# Patient Record
Sex: Male | Born: 1990 | Hispanic: No | Marital: Single | Smoking: Light tobacco smoker
Health system: Southern US, Community
[De-identification: ages and names within clinical notes are randomized; demographics above are authoritative.]

## PROBLEM LIST (undated history)

## (undated) DIAGNOSIS — K4090 Unilateral inguinal hernia, without obstruction or gangrene, not specified as recurrent: Secondary | ICD-10-CM

## (undated) DIAGNOSIS — J45909 Unspecified asthma, uncomplicated: Secondary | ICD-10-CM

## (undated) DIAGNOSIS — Z9889 Other specified postprocedural states: Secondary | ICD-10-CM

## (undated) DIAGNOSIS — R112 Nausea with vomiting, unspecified: Secondary | ICD-10-CM

## (undated) DIAGNOSIS — M5126 Other intervertebral disc displacement, lumbar region: Secondary | ICD-10-CM

## (undated) DIAGNOSIS — G971 Other reaction to spinal and lumbar puncture: Secondary | ICD-10-CM

## (undated) DIAGNOSIS — Z889 Allergy status to unspecified drugs, medicaments and biological substances status: Secondary | ICD-10-CM

## (undated) HISTORY — PX: CIRCUMCISION: SUR203

---

## 2016-09-06 ENCOUNTER — Emergency Department (HOSPITAL_COMMUNITY)
Admission: EM | Admit: 2016-09-06 | Discharge: 2016-09-06 | Disposition: A | Discharge status: Home / Self Care | Payer: Self-pay | Attending: Emergency Medicine | Admitting: Emergency Medicine

## 2016-09-06 ENCOUNTER — Encounter (HOSPITAL_COMMUNITY): Payer: Self-pay

## 2016-09-06 DIAGNOSIS — Y939 Activity, unspecified: Secondary | ICD-10-CM | POA: Insufficient documentation

## 2016-09-06 DIAGNOSIS — Y929 Unspecified place or not applicable: Secondary | ICD-10-CM | POA: Insufficient documentation

## 2016-09-06 DIAGNOSIS — T2112XA Burn of first degree of abdominal wall, initial encounter: Secondary | ICD-10-CM | POA: Insufficient documentation

## 2016-09-06 DIAGNOSIS — Z23 Encounter for immunization: Secondary | ICD-10-CM | POA: Insufficient documentation

## 2016-09-06 DIAGNOSIS — X19XXXA Contact with other heat and hot substances, initial encounter: Secondary | ICD-10-CM | POA: Insufficient documentation

## 2016-09-06 DIAGNOSIS — T24232A Burn of second degree of left lower leg, initial encounter: Secondary | ICD-10-CM

## 2016-09-06 DIAGNOSIS — Y990 Civilian activity done for income or pay: Secondary | ICD-10-CM | POA: Insufficient documentation

## 2016-09-06 DIAGNOSIS — T24231A Burn of second degree of right lower leg, initial encounter: Secondary | ICD-10-CM | POA: Insufficient documentation

## 2016-09-06 MED ORDER — BACITRACIN ZINC 500 UNIT/GM EX OINT
1.0000 "application " | TOPICAL_OINTMENT | Freq: Two times a day (BID) | CUTANEOUS | Status: DC
  Administered 2016-09-06: 1 via TOPICAL
  Filled 2016-09-06: qty 0.9

## 2016-09-06 MED ORDER — TETANUS-DIPHTH-ACELL PERTUSSIS 5-2.5-18.5 LF-MCG/0.5 IM SUSP
0.5000 mL | Freq: Once | INTRAMUSCULAR | Status: AC
  Administered 2016-09-06: 0.5 mL via INTRAMUSCULAR
  Filled 2016-09-06: qty 0.5

## 2016-09-06 MED ORDER — BACITRACIN ZINC 500 UNIT/GM EX OINT: 1.0000 "application " | TOPICAL_OINTMENT | Freq: Two times a day (BID) | CUTANEOUS | 0 refills | Status: DC
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
## 2016-09-06 NOTE — ED Notes (Signed)
Pt agreed to change into gown.   Pt still refuses to be hooked up to pulse ox and bp cuff.
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
## 2018-04-10 ENCOUNTER — Ambulatory Visit: Payer: Self-pay | Admitting: General Surgery

## 2018-04-10 NOTE — H&P (Signed)
  History of Present Illness (Estephania Licciardi MD; 04/10/2018 11:13 AM) The patient is a 26 year old male who presents with an inguinal hernia. Chief Complaint: Right abdominal pain  Patient is a 26-year-old male, otherwise healthy, who comes in with 2 months history of right inguinal pain. He states that the pain occurred after lifting [employer]. He states these were approximately 100 pound bags. Patient states he tenses repetitively in his line of Work. Patient states that he has been on light duty recently secondary to the increased pain. He does state that he has noticed a bulge in the right inguinal area when he does do some increased lifting. He states that he's had no signs or symptoms of incarceration or strangulation.  The patient had no previous abdominal surgery.    Allergies (Tanisha A. Brown, RMA; 04/10/2018 10:50 AM) No Known Drug Allergies [04/10/2018]: Allergies Reconciled   Medication History (Tanisha A. Brown, RMA; 04/10/2018 10:50 AM) No Current Medications Medications Reconciled    Review of Systems (Lailany Enoch, MD; 04/10/2018 11:13 AM) All other systems negative  Vitals (Tanisha A. Brown RMA; 04/10/2018 10:50 AM) 04/10/2018 10:49 AM Weight: 268.6 lb Height: 71in Body Surface Area: 2.39 m Body Mass Index: 37.46 kg/m  Temp.: 98.6F  Pulse: 80 (Regular)  BP: 116/64 (Sitting, Left Arm, Standard)       Physical Exam (Adea Geisel MD; 04/10/2018 11:13 AM) The physical exam findings are as follows: Note:Constitutional: No acute distress, conversant, appears stated age  Eyes: Anicteric sclerae, moist conjunctiva, no lid lag  Neck: No thyromegaly, trachea midline, no cervical lymphadenopathy  Lungs: Clear to auscultation biilaterally, normal respiratory effot  Cardiovascular: regular rate & rhythm, no murmurs, no peripheal edema, pedal pulses 2+  GI: Soft, no masses or hepatosplenomegaly, non-tender to palpation  MSK:  Normal gait, no clubbing cyanosis, edema  Skin: No rashes, palpation reveals normal skin turgor  Psychiatric: Appropriate judgment and insight, oriented to person, place, and time  Abdomen Inspection Hernias - Inguinal hernia - Right - Reducible.    Assessment & Plan (Nazia Rhines MD; 04/10/2018 11:13 AM) RIGHT INGUINAL HERNIA (K40.90) Impression: 26-year-old male with a right inguinal hernia  1. The patient will like to proceed to the operating room for laparoscopic right inguinal hernia repair with mesh.  2. I discussed with the patient the signs and symptoms of incarceration and strangulation and the need to proceed to the ER should they occur.  3. I discussed with the patient the risks and benefits of the procedure to include but not limited to: Infection, bleeding, damage to surrounding structures, possible need for further surgery, possible nerve pain, and possible recurrence. The patient was understanding and wishes to proceed. 

## 2018-04-10 NOTE — H&P (View-Only) (Signed)
  History of Present Illness Axel Filler(Johnothan Bascomb MD; 04/10/2018 11:13 AM) The patient is a 27 year old male who presents with an inguinal hernia. Chief Complaint: Right abdominal pain  Patient is a 27 year old male, otherwise healthy, who comes in with 2 months history of right inguinal pain. He states that the pain occurred after lifting [employer]. He states these were approximately 100 pound bags. Patient states he tenses repetitively in his line of Work. Patient states that he has been on light duty recently secondary to the increased pain. He does state that he has noticed a bulge in the right inguinal area when he does do some increased lifting. He states that he's had no signs or symptoms of incarceration or strangulation.  The patient had no previous abdominal surgery.    Allergies (Tanisha A. Manson PasseyBrown, RMA; 04/10/2018 10:50 AM) No Known Drug Allergies [04/10/2018]: Allergies Reconciled   Medication History (Tanisha A. Manson PasseyBrown, RMA; 04/10/2018 10:50 AM) No Current Medications Medications Reconciled    Review of Systems Axel Filler(Laurel Harnden, MD; 04/10/2018 11:13 AM) All other systems negative  Vitals (Tanisha A. Brown RMA; 04/10/2018 10:50 AM) 04/10/2018 10:49 AM Weight: 268.6 lb Height: 71in Body Surface Area: 2.39 m Body Mass Index: 37.46 kg/m  Temp.: 98.42F  Pulse: 80 (Regular)  BP: 116/64 (Sitting, Left Arm, Standard)       Physical Exam Axel Filler(Halie Gass MD; 04/10/2018 11:13 AM) The physical exam findings are as follows: Note:Constitutional: No acute distress, conversant, appears stated age  Eyes: Anicteric sclerae, moist conjunctiva, no lid lag  Neck: No thyromegaly, trachea midline, no cervical lymphadenopathy  Lungs: Clear to auscultation biilaterally, normal respiratory effot  Cardiovascular: regular rate & rhythm, no murmurs, no peripheal edema, pedal pulses 2+  GI: Soft, no masses or hepatosplenomegaly, non-tender to palpation  MSK:  Normal gait, no clubbing cyanosis, edema  Skin: No rashes, palpation reveals normal skin turgor  Psychiatric: Appropriate judgment and insight, oriented to person, place, and time  Abdomen Inspection Hernias - Inguinal hernia - Right - Reducible.    Assessment & Plan Axel Filler(Sherri Mcarthy MD; 04/10/2018 11:13 AM) RIGHT INGUINAL HERNIA (K40.90) Impression: 27 year old male with a right inguinal hernia  1. The patient will like to proceed to the operating room for laparoscopic right inguinal hernia repair with mesh.  2. I discussed with the patient the signs and symptoms of incarceration and strangulation and the need to proceed to the ER should they occur.  3. I discussed with the patient the risks and benefits of the procedure to include but not limited to: Infection, bleeding, damage to surrounding structures, possible need for further surgery, possible nerve pain, and possible recurrence. The patient was understanding and wishes to proceed.

## 2018-04-17 ENCOUNTER — Other Ambulatory Visit: Payer: Self-pay

## 2018-04-17 ENCOUNTER — Encounter (HOSPITAL_COMMUNITY): Payer: Self-pay | Admitting: *Deleted

## 2018-04-17 NOTE — Progress Notes (Signed)
Pt denies SOB, chest pain, and being under the care of a cardiologist. Pt denies having a stress test, echo and cardiac cath. Pt denies having an EKG and chest x ray within the last year. Pt denies recent labs. Pt made aware to stop taking  Aspirin, vitamins, fish oil and herbal medications. Do not take any NSAIDs ie: Ibuprofen, Advil, Naproxen (Aleve), Motrin, BC and Goody Powder. Pt verbalized understanding of all pre-op instructions. 

## 2018-04-18 ENCOUNTER — Encounter (HOSPITAL_COMMUNITY)
Admission: RE | Disposition: A | Discharge status: Home / Self Care | Payer: Self-pay | Source: Ambulatory Visit | Attending: General Surgery

## 2018-04-18 ENCOUNTER — Ambulatory Visit (HOSPITAL_COMMUNITY): Payer: BLUE CROSS/BLUE SHIELD | Admitting: Certified Registered Nurse Anesthetist

## 2018-04-18 ENCOUNTER — Encounter (HOSPITAL_COMMUNITY): Payer: Self-pay | Admitting: Urology

## 2018-04-18 ENCOUNTER — Ambulatory Visit (HOSPITAL_COMMUNITY)
Admission: RE | Admit: 2018-04-18 | Discharge: 2018-04-18 | Disposition: A | Discharge status: Home / Self Care | Payer: BLUE CROSS/BLUE SHIELD | Source: Ambulatory Visit | Attending: General Surgery | Admitting: General Surgery

## 2018-04-18 DIAGNOSIS — F17200 Nicotine dependence, unspecified, uncomplicated: Secondary | ICD-10-CM | POA: Insufficient documentation

## 2018-04-18 DIAGNOSIS — K4090 Unilateral inguinal hernia, without obstruction or gangrene, not specified as recurrent: Secondary | ICD-10-CM | POA: Diagnosis not present

## 2018-04-18 HISTORY — DX: Unilateral inguinal hernia, without obstruction or gangrene, not specified as recurrent: K40.90

## 2018-04-18 HISTORY — DX: Other reaction to spinal and lumbar puncture: G97.1

## 2018-04-18 HISTORY — PX: INGUINAL HERNIA REPAIR: SHX194

## 2018-04-18 HISTORY — DX: Unspecified asthma, uncomplicated: J45.909

## 2018-04-18 HISTORY — DX: Nausea with vomiting, unspecified: R11.2

## 2018-04-18 HISTORY — DX: Other intervertebral disc displacement, lumbar region: M51.26

## 2018-04-18 HISTORY — PX: INSERTION OF MESH: SHX5868

## 2018-04-18 HISTORY — DX: Other specified postprocedural states: Z98.890

## 2018-04-18 HISTORY — DX: Allergy status to unspecified drugs, medicaments and biological substances: Z88.9

## 2018-04-18 LAB — HEMOGLOBIN: Hemoglobin: 14 g/dL (ref 13.0–17.0)

## 2018-04-18 SURGERY — REPAIR, HERNIA, INGUINAL, LAPAROSCOPIC
Anesthesia: General | Site: Inguinal | Laterality: Right

## 2018-04-18 MED ORDER — OXYCODONE HCL 5 MG PO TABS
ORAL_TABLET | ORAL | Status: AC
  Filled 2018-04-18: qty 1

## 2018-04-18 MED ORDER — OXYCODONE HCL 5 MG PO TABS
5.0000 mg | ORAL_TABLET | Freq: Once | ORAL | Status: AC | PRN
  Administered 2018-04-18: 5 mg via ORAL

## 2018-04-18 MED ORDER — DEXAMETHASONE SODIUM PHOSPHATE 10 MG/ML IJ SOLN
INTRAMUSCULAR | Status: DC | PRN
  Administered 2018-04-18: 10 mg via INTRAVENOUS

## 2018-04-18 MED ORDER — CHLORHEXIDINE GLUCONATE CLOTH 2 % EX PADS: 6.0000 | MEDICATED_PAD | Freq: Once | CUTANEOUS | Status: DC

## 2018-04-18 MED ORDER — CEFAZOLIN SODIUM-DEXTROSE 2-4 GM/100ML-% IV SOLN
2.0000 g | INTRAVENOUS | Status: AC
  Administered 2018-04-18: 2 g via INTRAVENOUS
  Filled 2018-04-18: qty 100

## 2018-04-18 MED ORDER — ACETAMINOPHEN 500 MG PO TABS
1000.0000 mg | ORAL_TABLET | ORAL | Status: AC
  Administered 2018-04-18: 1000 mg via ORAL
  Filled 2018-04-18: qty 2

## 2018-04-18 MED ORDER — TRAMADOL HCL 50 MG PO TABS: 50.0000 mg | ORAL_TABLET | Freq: Four times a day (QID) | ORAL | 0 refills | Status: AC | PRN

## 2018-04-18 MED ORDER — KETOROLAC TROMETHAMINE 30 MG/ML IJ SOLN
30.0000 mg | Freq: Once | INTRAMUSCULAR | Status: AC | PRN
  Administered 2018-04-18: 30 mg via INTRAVENOUS

## 2018-04-18 MED ORDER — SUGAMMADEX SODIUM 500 MG/5ML IV SOLN
INTRAVENOUS | Status: DC | PRN
  Administered 2018-04-18: 240 mg via INTRAVENOUS

## 2018-04-18 MED ORDER — OXYCODONE HCL 5 MG/5ML PO SOLN: 5.0000 mg | Freq: Once | ORAL | Status: AC | PRN

## 2018-04-18 MED ORDER — MIDAZOLAM HCL 2 MG/2ML IJ SOLN
INTRAMUSCULAR | Status: AC
  Filled 2018-04-18: qty 2

## 2018-04-18 MED ORDER — LIDOCAINE 2% (20 MG/ML) 5 ML SYRINGE
INTRAMUSCULAR | Status: DC | PRN
  Administered 2018-04-18: 60 mg via INTRAVENOUS

## 2018-04-18 MED ORDER — DEXMEDETOMIDINE HCL IN NACL 200 MCG/50ML IV SOLN
INTRAVENOUS | Status: AC
  Filled 2018-04-18: qty 50

## 2018-04-18 MED ORDER — DEXAMETHASONE SODIUM PHOSPHATE 10 MG/ML IJ SOLN
INTRAMUSCULAR | Status: AC
  Filled 2018-04-18: qty 1

## 2018-04-18 MED ORDER — ROCURONIUM BROMIDE 100 MG/10ML IV SOLN
INTRAVENOUS | Status: DC | PRN
  Administered 2018-04-18: 50 mg via INTRAVENOUS

## 2018-04-18 MED ORDER — GABAPENTIN 300 MG PO CAPS
300.0000 mg | ORAL_CAPSULE | ORAL | Status: AC
  Administered 2018-04-18: 300 mg via ORAL
  Filled 2018-04-18: qty 1

## 2018-04-18 MED ORDER — CELECOXIB 200 MG PO CAPS
200.0000 mg | ORAL_CAPSULE | ORAL | Status: AC
  Administered 2018-04-18: 200 mg via ORAL
  Filled 2018-04-18: qty 1

## 2018-04-18 MED ORDER — KETOROLAC TROMETHAMINE 30 MG/ML IJ SOLN
INTRAMUSCULAR | Status: AC
  Filled 2018-04-18: qty 1

## 2018-04-18 MED ORDER — SCOPOLAMINE 1 MG/3DAYS TD PT72
MEDICATED_PATCH | TRANSDERMAL | Status: DC | PRN
  Administered 2018-04-18: 1 via TRANSDERMAL

## 2018-04-18 MED ORDER — PROPOFOL 10 MG/ML IV BOLUS
INTRAVENOUS | Status: AC
  Filled 2018-04-18: qty 40

## 2018-04-18 MED ORDER — DEXMEDETOMIDINE HCL IN NACL 200 MCG/50ML IV SOLN
INTRAVENOUS | Status: DC | PRN
  Administered 2018-04-18 (×3): 8 ug via INTRAVENOUS
  Administered 2018-04-18: 20 ug via INTRAVENOUS
  Administered 2018-04-18: 4 ug via INTRAVENOUS

## 2018-04-18 MED ORDER — SCOPOLAMINE 1 MG/3DAYS TD PT72
MEDICATED_PATCH | TRANSDERMAL | Status: AC
  Filled 2018-04-18: qty 1

## 2018-04-18 MED ORDER — SUGAMMADEX SODIUM 500 MG/5ML IV SOLN
INTRAVENOUS | Status: AC
  Filled 2018-04-18: qty 5

## 2018-04-18 MED ORDER — MIDAZOLAM HCL 2 MG/2ML IJ SOLN
INTRAMUSCULAR | Status: DC | PRN
  Administered 2018-04-18: 2 mg via INTRAVENOUS

## 2018-04-18 MED ORDER — ROCURONIUM BROMIDE 50 MG/5ML IV SOLN
INTRAVENOUS | Status: AC
  Filled 2018-04-18: qty 1

## 2018-04-18 MED ORDER — LIDOCAINE 2% (20 MG/ML) 5 ML SYRINGE
INTRAMUSCULAR | Status: AC
  Filled 2018-04-18: qty 5

## 2018-04-18 MED ORDER — LACTATED RINGERS IV SOLN
INTRAVENOUS | Status: DC | PRN
  Administered 2018-04-18 (×2): via INTRAVENOUS

## 2018-04-18 MED ORDER — ONDANSETRON HCL 4 MG/2ML IJ SOLN
INTRAMUSCULAR | Status: DC | PRN
  Administered 2018-04-18: 4 mg via INTRAVENOUS

## 2018-04-18 MED ORDER — 0.9 % SODIUM CHLORIDE (POUR BTL) OPTIME
TOPICAL | Status: DC | PRN
  Administered 2018-04-18: 1000 mL

## 2018-04-18 MED ORDER — FENTANYL CITRATE (PF) 100 MCG/2ML IJ SOLN: 25.0000 ug | INTRAMUSCULAR | Status: DC | PRN

## 2018-04-18 MED ORDER — ONDANSETRON HCL 4 MG/2ML IJ SOLN
INTRAMUSCULAR | Status: AC
  Filled 2018-04-18: qty 2

## 2018-04-18 MED ORDER — BUPIVACAINE HCL 0.25 % IJ SOLN
INTRAMUSCULAR | Status: DC | PRN
  Administered 2018-04-18: 7 mL

## 2018-04-18 MED ORDER — BUPIVACAINE HCL (PF) 0.25 % IJ SOLN
INTRAMUSCULAR | Status: AC
  Filled 2018-04-18: qty 30

## 2018-04-18 MED ORDER — PROPOFOL 10 MG/ML IV BOLUS
INTRAVENOUS | Status: DC | PRN
  Administered 2018-04-18: 200 mg via INTRAVENOUS

## 2018-04-18 MED ORDER — FENTANYL CITRATE (PF) 250 MCG/5ML IJ SOLN
INTRAMUSCULAR | Status: DC | PRN
  Administered 2018-04-18: 50 ug via INTRAVENOUS
  Administered 2018-04-18: 100 ug via INTRAVENOUS

## 2018-04-18 MED ORDER — PROMETHAZINE HCL 25 MG/ML IJ SOLN: 6.2500 mg | INTRAMUSCULAR | Status: DC | PRN

## 2018-04-18 MED ORDER — FENTANYL CITRATE (PF) 250 MCG/5ML IJ SOLN
INTRAMUSCULAR | Status: AC
  Filled 2018-04-18: qty 5

## 2018-04-18 SURGICAL SUPPLY — 43 items
APPLIER CLIP 5 13 M/L LIGAMAX5 (MISCELLANEOUS)
BENZOIN TINCTURE PRP APPL 2/3 (GAUZE/BANDAGES/DRESSINGS) IMPLANT
CANISTER SUCT 3000ML PPV (MISCELLANEOUS) IMPLANT
CHLORAPREP W/TINT 26ML (MISCELLANEOUS) ×3 IMPLANT
CLIP APPLIE 5 13 M/L LIGAMAX5 (MISCELLANEOUS) IMPLANT
CLOSURE WOUND 1/2 X4 (GAUZE/BANDAGES/DRESSINGS)
COVER SURGICAL LIGHT HANDLE (MISCELLANEOUS) ×3 IMPLANT
DERMABOND ADVANCED (GAUZE/BANDAGES/DRESSINGS) ×2
DERMABOND ADVANCED .7 DNX12 (GAUZE/BANDAGES/DRESSINGS) ×1 IMPLANT
DISSECTOR BLUNT TIP ENDO 5MM (MISCELLANEOUS) ×3 IMPLANT
ELECT REM PT RETURN 9FT ADLT (ELECTROSURGICAL) ×3
ELECTRODE REM PT RTRN 9FT ADLT (ELECTROSURGICAL) ×1 IMPLANT
GAUZE SPONGE 2X2 8PLY STRL LF (GAUZE/BANDAGES/DRESSINGS) IMPLANT
GLOVE BIO SURGEON STRL SZ7.5 (GLOVE) ×3 IMPLANT
GOWN STRL REUS W/ TWL LRG LVL3 (GOWN DISPOSABLE) ×2 IMPLANT
GOWN STRL REUS W/ TWL XL LVL3 (GOWN DISPOSABLE) ×1 IMPLANT
GOWN STRL REUS W/TWL LRG LVL3 (GOWN DISPOSABLE) ×4
GOWN STRL REUS W/TWL XL LVL3 (GOWN DISPOSABLE) ×2
KIT BASIN OR (CUSTOM PROCEDURE TRAY) ×3 IMPLANT
KIT TURNOVER KIT B (KITS) ×3 IMPLANT
MESH 3DMAX 5X7 RT XLRG (Mesh General) ×3 IMPLANT
NEEDLE INSUFFLATION 14GA 120MM (NEEDLE) ×3 IMPLANT
NS IRRIG 1000ML POUR BTL (IV SOLUTION) ×3 IMPLANT
PAD ARMBOARD 7.5X6 YLW CONV (MISCELLANEOUS) ×6 IMPLANT
RELOAD STAPLE HERNIA 4.0 BLUE (INSTRUMENTS) ×3 IMPLANT
RELOAD STAPLE HERNIA 4.8 BLK (STAPLE) IMPLANT
SCISSORS LAP 5X35 DISP (ENDOMECHANICALS) ×3 IMPLANT
SET IRRIG TUBING LAPAROSCOPIC (IRRIGATION / IRRIGATOR) IMPLANT
SET TROCAR LAP APPLE-HUNT 5MM (ENDOMECHANICALS) ×3 IMPLANT
SPONGE GAUZE 2X2 STER 10/PKG (GAUZE/BANDAGES/DRESSINGS)
STAPLER HERNIA 12 8.5 360D (INSTRUMENTS) ×3 IMPLANT
STRIP CLOSURE SKIN 1/2X4 (GAUZE/BANDAGES/DRESSINGS) IMPLANT
SUT MNCRL AB 4-0 PS2 18 (SUTURE) ×6 IMPLANT
SUT VIC AB 1 CT1 27 (SUTURE)
SUT VIC AB 1 CT1 27XBRD ANBCTR (SUTURE) IMPLANT
SYRINGE TOOMEY DISP (SYRINGE) ×3 IMPLANT
TOWEL OR 17X24 6PK STRL BLUE (TOWEL DISPOSABLE) ×3 IMPLANT
TOWEL OR 17X26 10 PK STRL BLUE (TOWEL DISPOSABLE) ×3 IMPLANT
TRAY FOLEY W/BAG SLVR 14FR (SET/KITS/TRAYS/PACK) ×3 IMPLANT
TRAY LAPAROSCOPIC MC (CUSTOM PROCEDURE TRAY) ×3 IMPLANT
TROCAR XCEL 12X100 BLDLESS (ENDOMECHANICALS) ×3 IMPLANT
TUBING INSUFFLATION (TUBING) ×3 IMPLANT
WATER STERILE IRR 1000ML POUR (IV SOLUTION) ×3 IMPLANT

## 2018-04-18 NOTE — Transfer of Care (Signed)
Immediate Anesthesia Transfer of Care Note  Patient: Christopher DarnerChristopher Ramirez  Procedure(s) Performed: LAPAROSCOPIC RIGHT INGUINAL HERNIA (Right Inguinal) INSERTION OF MESH (Right Inguinal)  Patient Location: PACU  Anesthesia Type:General  Level of Consciousness: drowsy and patient cooperative  Airway & Oxygen Therapy: Patient Spontanous Breathing and Patient connected to face mask oxygen  Post-op Assessment: Report given to RN and Post -op Vital signs reviewed and stable  Post vital signs: Reviewed and stable  Last Vitals:  Vitals Value Taken Time  BP    Temp    Pulse 70 04/18/2018  8:41 AM  Resp 18 04/18/2018  8:41 AM  SpO2 91 % 04/18/2018  8:41 AM  Vitals shown include unvalidated device data.  Last Pain:  Vitals:   04/18/18 0613  TempSrc:   PainSc: 0-No pain      Patients Stated Pain Goal: 0 (04/18/18 [id number])  Complications: No apparent anesthesia complications

## 2018-04-18 NOTE — Anesthesia Preprocedure Evaluation (Signed)
Anesthesia Evaluation  Patient identified by MRN, date of birth, ID band Patient awake    Reviewed: Allergy & Precautions, NPO status , Patient's Chart, lab work & pertinent test results  History of Anesthesia Complications (+) PONV  Airway Mallampati: II  TM Distance: >3 FB Neck ROM: Full    Dental no notable dental hx.    Pulmonary Current Smoker,    Pulmonary exam normal breath sounds clear to auscultation       Cardiovascular negative cardio ROS Normal cardiovascular exam Rhythm:Regular Rate:Normal     Neuro/Psych negative neurological ROS  negative psych ROS   GI/Hepatic negative GI ROS, Neg liver ROS,   Endo/Other  negative endocrine ROS  Renal/GU negative Renal ROS  negative genitourinary   Musculoskeletal negative musculoskeletal ROS (+)   Abdominal   Peds negative pediatric ROS (+)  Hematology negative hematology ROS (+)   Anesthesia Other Findings   Reproductive/Obstetrics negative OB ROS                             Anesthesia Physical Anesthesia Plan  ASA: II  Anesthesia Plan: General   Post-op Pain Management:    Induction: Intravenous  PONV Risk Score and Plan: 2 and Ondansetron, Dexamethasone and Treatment may vary due to age or medical condition  Airway Management Planned: Oral ETT  Additional Equipment:   Intra-op Plan:   Post-operative Plan: Extubation in OR  Informed Consent: I have reviewed the patients History and Physical, chart, labs and discussed the procedure including the risks, benefits and alternatives for the proposed anesthesia with the patient or authorized representative who has indicated his/her understanding and acceptance.   Dental advisory given  Plan Discussed with: CRNA and Surgeon  Anesthesia Plan Comments:         Anesthesia Quick Evaluation

## 2018-04-18 NOTE — Interval H&P Note (Signed)
History and Physical Interval Note:  04/18/2018 7:22 AM  Christopher Ramirez  has presented today for surgery, with the diagnosis of right inguinal hernia  The various methods of treatment have been discussed with the patient and family. After consideration of risks, benefits and other options for treatment, the patient has consented to  Procedure(s): LAPAROSCOPIC RIGHT INGUINAL HERNIA (Right) INSERTION OF MESH (Right) as a surgical intervention .  The patient's history has been reviewed, patient examined, no change in status, stable for surgery.  I have reviewed the patient's chart and labs.  Questions were answered to the patient's satisfaction.     Marigene Ehlersamirez Jr., Jed LimerickArmando

## 2018-04-18 NOTE — Anesthesia Postprocedure Evaluation (Signed)
Anesthesia Post Note  Patient: Christopher Ramirez  Procedure(s) Performed: LAPAROSCOPIC RIGHT INGUINAL HERNIA (Right Inguinal) INSERTION OF MESH (Right Inguinal)     Patient location during evaluation: PACU Anesthesia Type: General Level of consciousness: awake and alert Pain management: pain level controlled Vital Signs Assessment: post-procedure vital signs reviewed and stable Respiratory status: spontaneous breathing, nonlabored ventilation, respiratory function stable and patient connected to nasal cannula oxygen Cardiovascular status: blood pressure returned to baseline and stable Postop Assessment: no apparent nausea or vomiting Anesthetic complications: no    Last Vitals:  Vitals:   04/18/18 0922 04/18/18 0927  BP: 127/68   Pulse: 69 67  Resp: 17 16  Temp:    SpO2: (!) 87% 94%    Last Pain:  Vitals:   04/18/18 0927  TempSrc:   PainSc: Asleep                 Ario Mcdiarmid S

## 2018-04-18 NOTE — Anesthesia Procedure Notes (Signed)
Procedure Name: Intubation Date/Time: 04/18/2018 7:38 AM Performed by: Yolonda Kidaarver, Chenise Mulvihill L, CRNA Pre-anesthesia Checklist: Patient identified, Emergency Drugs available, Suction available and Patient being monitored Patient Re-evaluated:Patient Re-evaluated prior to induction Oxygen Delivery Method: Circle system utilized Preoxygenation: Pre-oxygenation with 100% oxygen Induction Type: IV induction Ventilation: Mask ventilation without difficulty Laryngoscope Size: Miller and 3 Grade View: Grade I Tube type: Oral Tube size: 7.5 mm Number of attempts: 1 Airway Equipment and Method: Stylet Placement Confirmation: ETT inserted through vocal cords under direct vision,  positive ETCO2,  CO2 detector and breath sounds checked- equal and bilateral Secured at: 22 cm Tube secured with: Tape Dental Injury: Teeth and Oropharynx as per pre-operative assessment

## 2018-04-18 NOTE — Discharge Instructions (Signed)
[facility] _______[facility] ° °HERNIA REPAIR: POST OP INSTRUCTIONS ° °Always review your discharge instruction sheet given to you by the facility where your surgery was performed. °IF YOU HAVE DISABILITY OR FAMILY LEAVE FORMS, YOU MUST BRING THEM TO THE OFFICE FOR PROCESSING.   °DO NOT GIVE THEM TO YOUR DOCTOR. ° °1. A  prescription for pain medication may be given to you upon discharge.  Take your pain medication as prescribed, if needed.  If narcotic pain medicine is not needed, then you may take acetaminophen (Tylenol) or ibuprofen (Advil) as needed. °2. Take your usually prescribed medications unless otherwise directed. °If you need a refill on your pain medication, please contact your pharmacy.  They will contact our office to request authorization. Prescriptions will not be filled after 5 pm or on week-ends. °3. You should follow a light diet the first 24 hours after arrival home, such as soup and crackers, etc.  Be sure to include lots of fluids daily.  Resume your normal diet the day after surgery. °4.Most patients will experience some swelling and bruising around the umbilicus or in the groin and scrotum.  Ice packs and reclining will help.  Swelling and bruising can take several days to resolve.  °6. It is common to experience some constipation if taking pain medication after surgery.  Increasing fluid intake and taking a stool softener (such as Colace) will usually help or prevent this problem from occurring.  A mild laxative (Milk of Magnesia or Miralax) should be taken according to package directions if there are no bowel movements after 48 hours. °7. Unless discharge instructions indicate otherwise, you may remove your bandages 24-48 hours after surgery, and you may shower at that time.  You may have steri-strips (small skin tapes) in place directly over the incision.  These strips should be left on the skin for 7-10 days.  If your surgeon used skin glue on the incision, you may shower in  24 hours.  The glue will flake off over the next 2-3 weeks.  Any sutures or staples will be removed at the office during your follow-up visit. °8. ACTIVITIES:  You may resume regular (light) daily activities beginning the next day--such as daily self-care, walking, climbing stairs--gradually increasing activities as tolerated.  You may have sexual intercourse when it is comfortable.  Refrain from any heavy lifting or straining until approved by your doctor. ° °a.You may drive when you are no longer taking prescription pain medication, you can comfortably wear a seatbelt, and you can safely maneuver your car and apply brakes. °b.RETURN TO WORK:   °_____________________________________________ ° °9.You should see your doctor in the office for a follow-up appointment approximately 2-3 weeks after your surgery.  Make sure that you call for this appointment within a day or two after you arrive home to insure a convenient appointment time. °10.OTHER INSTRUCTIONS: _________________________ °   _____________________________________ ° °WHEN TO CALL YOUR DOCTOR: °1. Fever over 101.0 °2. Inability to urinate °3. Nausea and/or vomiting °4. Extreme swelling or bruising °5. Continued bleeding from incision. °6. Increased pain, redness, or drainage from the incision ° °The clinic staff is available to answer your questions during regular business hours.  Please don’t hesitate to call and ask to speak to one of the nurses for clinical concerns.  If you have a medical emergency, go to the nearest emergency room or call 911.  A surgeon from [facility] is always on call at the hospital ° ° °[address], [address], [place], [place]  [address] ? ° [address] [address], [place], [place]   [address] °[phone] ? [phone] ? FAX [phone] °Web site: [email or web address] ° °General Anesthesia, Adult, Care After °These instructions provide you with information about caring for yourself after your procedure.  Your health care provider may also give you more specific instructions. Your treatment has been planned according to current medical practices, but problems sometimes occur. Call your health care provider if you have any problems or questions after your procedure. °What can I expect after the procedure? °After the procedure, it is common to have: °· Vomiting. °· A sore throat. °· Mental slowness. ° °It is common to feel: °· Nauseous. °· Cold or shivery. °· Sleepy. °· Tired. °· Sore or achy, even in parts of your body where you did not have surgery. ° °Follow these instructions at home: °For at least 24 hours after the procedure: °· Do not: °? Participate in activities where you could fall or become injured. °? Drive. °? Use heavy machinery. °? Drink alcohol. °? Take sleeping pills or medicines that cause drowsiness. °? Make important decisions or sign legal documents. °? Take care of children on your own. °· Rest. °Eating and drinking °· If you vomit, drink water, juice, or soup when you can drink without vomiting. °· Drink enough fluid to keep your urine clear or pale yellow. °· Make sure you have little or no nausea before eating solid foods. °· Follow the diet recommended by your health care provider. °General instructions °· Have a responsible adult stay with you until you are awake and alert. °· Return to your normal activities as told by your health care provider. Ask your health care provider what activities are safe for you. °· Take over-the-counter and prescription medicines only as told by your health care provider. °· If you smoke, do not smoke without supervision. °· Keep all follow-up visits as told by your health care provider. This is important. °Contact a health care provider if: °· You continue to have nausea or vomiting at home, and medicines are not helpful. °· You cannot drink fluids or start eating again. °· You cannot urinate after 8-12 hours. °· You develop a skin rash. °· You have  fever. °· You have increasing redness at the site of your procedure. °Get help right away if: °· You have difficulty breathing. °· You have chest pain. °· You have unexpected bleeding. °· You feel that you are having a life-threatening or urgent problem. °This information is not intended to replace advice given to you by your health care provider. Make sure you discuss any questions you have with your health care provider. °Document Released: 01/24/2001 Document Revised: 03/22/2016 Document Reviewed: 10/02/2015 °[employer] Interactive Patient Education © 2018 [employer] ° ° °

## 2018-04-18 NOTE — Op Note (Signed)
04/18/2018  8:23 AM  PATIENT:  Otho Darnerhristopher Pettus  27 y.o. male  PRE-OPERATIVE DIAGNOSIS:  right inguinal hernia  POST-OPERATIVE DIAGNOSIS:  Small Right indirect inguinal hernia  PROCEDURE:  Procedure(s): LAPAROSCOPIC RIGHT INGUINAL HERNIA (Right) INSERTION OF MESH (Right)  SURGEON:  Surgeon(s) and Role:    Axel Filler* Momina Hunton, MD - Primary  ANESTHESIA:   local and general  EBL:  5 mL   BLOOD ADMINISTERED:none  DRAINS: none   LOCAL MEDICATIONS USED:  BUPIVICAINE   SPECIMEN:  No Specimen  DISPOSITION OF SPECIMEN:  N/A  COUNTS:  YES  TOURNIQUET:  * No tourniquets in log *  DICTATION: .Dragon Dictation   Counts: reported as correct x 2  Findings:  The patient had a small right indirect hernia  Indications for procedure:  The patient is a 27 year old male with a right hernia for several months. Patient complained of symptomatology to his right inguinal area. The patient was taken back for elective inguinal hernia repair.  Details of the procedure: The patient was taken back to the operating room. The patient was placed in supine position with bilateral SCDs in place.  The patient was prepped and draped in the usual sterile fashion.  After appropriate anitbiotics were confirmed, a time-out was confirmed and all facts were verified.  0.25% Marcaine was used to infiltrate the umbilical area. A 11-blade was used to cut down the skin and blunt dissection was used to get the anterior fashion.  The anterior fascia was incised approximately 1 cm and the muscles were retracted laterally. Blunt dissection was then used to create a space in the preperitoneal area. At this time a 10 mm camera was then introduced into the space and advanced the pubic tubercle and a 12 mm trocar was placed over this and insufflation was started.  At this time and space was created from medial to laterally the preperitoneal space.  Cooper's ligament was initially cleaned off.  The hernia sac was identified  in the indirect space. Dissection of the hernia sac was undertaken the vas deferens was identified and protected in all parts of the case.    Once the hernia sac was taken down to approximately the level of the umbilicus a Bard 3D Max mesh, size: Barney DrainXLarge, was  introduced into the preperitoneal space.  The mesh was brought over to cover the direct and indirect hernia spaces.  This was anchored into place and secured to Cooper's ligament with 4.680mm staples from a Coviden hernia stapler. It was anchored to the anterior abdominal wall with 4.8 mm staples. The hernia sac was seen lying posterior to the mesh. There was no staples placed laterally. The insufflation was evacuated and the peritoneum was seen posterior to the mesh. The trochars were removed. The anterior fascia was reapproximated using #1 Vicryl on a UR- 6.  Intra-abdominal air was evacuated and the Veress needle removed. The skin was reapproximated using 4-0 Monocryl subcuticular fashion.  The skin was dressed with Dermabond. The patient was awakened from general anesthesia and taken to recovery in stable condition.   PLAN OF CARE: Discharge to home after PACU  PATIENT DISPOSITION:  PACU - hemodynamically stable.   Delay start of Pharmacological VTE agent (>24hrs) due to surgical blood loss or risk of bleeding: not applicable

## 2018-04-19 ENCOUNTER — Encounter (HOSPITAL_COMMUNITY): Payer: Self-pay | Admitting: General Surgery

## 2018-06-03 ENCOUNTER — Other Ambulatory Visit: Payer: Self-pay

## 2018-06-03 ENCOUNTER — Emergency Department
Admission: EM | Admit: 2018-06-03 | Discharge: 2018-06-03 | Disposition: A | Discharge status: Home / Self Care | Payer: BLUE CROSS/BLUE SHIELD | Attending: Emergency Medicine | Admitting: Emergency Medicine

## 2018-06-03 ENCOUNTER — Emergency Department: Payer: BLUE CROSS/BLUE SHIELD

## 2018-06-03 DIAGNOSIS — X58XXXA Exposure to other specified factors, initial encounter: Secondary | ICD-10-CM | POA: Insufficient documentation

## 2018-06-03 DIAGNOSIS — Z79899 Other long term (current) drug therapy: Secondary | ICD-10-CM | POA: Insufficient documentation

## 2018-06-03 DIAGNOSIS — F17210 Nicotine dependence, cigarettes, uncomplicated: Secondary | ICD-10-CM | POA: Insufficient documentation

## 2018-06-03 DIAGNOSIS — Y9389 Activity, other specified: Secondary | ICD-10-CM | POA: Insufficient documentation

## 2018-06-03 DIAGNOSIS — S39012A Strain of muscle, fascia and tendon of lower back, initial encounter: Secondary | ICD-10-CM

## 2018-06-03 DIAGNOSIS — J45909 Unspecified asthma, uncomplicated: Secondary | ICD-10-CM | POA: Insufficient documentation

## 2018-06-03 DIAGNOSIS — S3992XA Unspecified injury of lower back, initial encounter: Secondary | ICD-10-CM | POA: Diagnosis present

## 2018-06-03 DIAGNOSIS — Y990 Civilian activity done for income or pay: Secondary | ICD-10-CM | POA: Diagnosis not present

## 2018-06-03 DIAGNOSIS — Y9289 Other specified places as the place of occurrence of the external cause: Secondary | ICD-10-CM | POA: Diagnosis not present

## 2018-06-03 MED ORDER — ORPHENADRINE CITRATE ER 100 MG PO TB12: 100.0000 mg | ORAL_TABLET | Freq: Two times a day (BID) | ORAL | 0 refills | Status: AC

## 2018-06-03 MED ORDER — OXYCODONE-ACETAMINOPHEN 7.5-325 MG PO TABS: 1.0000 | ORAL_TABLET | Freq: Four times a day (QID) | ORAL | 0 refills | Status: AC | PRN

## 2018-06-03 MED ORDER — HYDROMORPHONE HCL 1 MG/ML IJ SOLN
1.0000 mg | Freq: Once | INTRAMUSCULAR | Status: AC
  Administered 2018-06-03: 1 mg via INTRAMUSCULAR
  Filled 2018-06-03: qty 1

## 2018-06-03 MED ORDER — ORPHENADRINE CITRATE 30 MG/ML IJ SOLN
60.0000 mg | Freq: Two times a day (BID) | INTRAMUSCULAR | Status: DC
  Administered 2018-06-03: 60 mg via INTRAMUSCULAR
  Filled 2018-06-03: qty 2

## 2018-06-03 MED ORDER — NAPROXEN 500 MG PO TABS: 500.0000 mg | ORAL_TABLET | Freq: Two times a day (BID) | ORAL | Status: AC

## 2018-06-03 NOTE — ED Provider Notes (Signed)
[facility] Emergency Department Provider Note   ____________________________________________   First MD Initiated Contact with Patient 06/03/18 1158     (approximate)  I have reviewed the triage vital signs and the nursing notes.   HISTORY  Chief Complaint Back Pain    HPI Christopher Ramirez is a 27 y.o. male patient complain of acute low back pain with radicular component to the lower extremities.  Patient denies bladder bowel dysfunction.  Pain  occurred after a bending and lifting incident at work yesterday.  Patient state he has a history of herniated disks 7 years ago.  Patient rates pain as a 6/10.  Patient described the pain is "achy".  No palliative measure for complaint.  Past Medical History:  Diagnosis Date  . Asthma    as a child  . 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 History of seasonal allergies   . Inguinal hernia   . PONV (postoperative nausea and vomiting)   . Spinal headache     There are no active problems to display for this patient.   Past Surgical History:  Procedure Laterality Date  . CIRCUMCISION    . INGUINAL HERNIA REPAIR Right 04/18/2018   Procedure: LAPAROSCOPIC RIGHT INGUINAL HERNIA;  Surgeon: Axel Filler, MD;  Location: [facility] OR;  Service: General;  Laterality: Right;  . INSERTION OF MESH Right 04/18/2018   Procedure: INSERTION OF MESH;  Surgeon: Axel Filler, MD;  Location[facility] OR;  Service: General;  Laterality: Right;    Prior to Admission medications   Medication Sig Start Date End Date Taking? Authorizing Provider  cetirizine (ZYRTEC) 10 MG tablet Take 10 mg by mouth as needed for allergies.    [provider]  naproxen (NAPROSYN) 500 MG tablet Take 1 tablet (500 mg total) by mouth 2 (two) times daily with a meal. 06/03/18   Joni Reining, PA-C  orphenadrine (NORFLEX) 100 MG tablet Take 1 tablet (100 mg total) by mouth 2 (two) times daily. 06/03/18   Joni Reining,  PA-C  OVER THE COUNTER MEDICATION Take 1 tablet by mouth 3 (three) times daily. It Works Dietary Supplement (Calcium, Chromium and Caffeine)    [provider]  oxyCODONE-acetaminophen (PERCOCET) 7.5-325 MG tablet Take 1 tablet by mouth every 6 (six) hours as needed for severe pain. 06/03/18   Joni Reining, PA-C  traMADol (ULTRAM) 50 MG tablet Take 1 tablet (50 mg total) by mouth every 6 (six) hours as needed. 04/18/18 04/18/19  Axel Filler, MD    Allergies Patient has no known allergies.  Family History  Problem Relation Age of Onset  . Arthritis Mother   . Bronchitis Mother     Social History Social History   Tobacco Use  . Smoking status: Light Tobacco Smoker    Types: Cigarettes  . Smokeless tobacco: Never Used  Substance Use Topics  . Alcohol use: Yes    Comment: rare  . 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 ENT: No sore throat. Cardiovascular: Denies chest pain. Respiratory: Denies shortness of breath. Gastrointestinal: No abdominal pain.  No nausea, no vomiting.  No diarrhea.  No constipation. Genitourinary: Negative for dysuria. Musculoskeletal: Positive for back pain. Skin: Negative for rash. Neurological: Negative for headaches, focal weakness or numbness.   ____________________________________________   PHYSICAL EXAM:  VITAL SIGNS: ED Triage Vitals  Enc Vitals Group  BP 06/03/18 1158 138/82     Pulse Rate 06/03/18 1158 67     Resp 06/03/18 1158 16     Temp 06/03/18 1158 98.7 F (37.1 C)     Temp Source 06/03/18 1158 Oral     SpO2 06/03/18 1158 100 %     Weight 06/03/18 1155 255 lb (115.7 kg)     Height 06/03/18 1155 5\' 11"  (1.803 m)     Head Circumference --      Peak Flow --      Pain Score 06/03/18 1155 6     Pain Loc --      Pain Edu? --      Excl. in GC? --    Constitutional: Alert and oriented. Well appearing and in no acute  distress. Neck: No cervical spine tenderness to palpation. Hematological/Lymphatic/Immunilogical: No cervical lymphadenopathy. Cardiovascular: Normal rate, regular rhythm. Grossly normal heart sounds.  Good peripheral circulation. Respiratory: Normal respiratory effort.  No retractions. Lungs CTAB. Gastrointestinal: Soft and nontender. No distention. No abdominal bruits. No CVA tenderness. Musculoskeletal: No obvious deformity to the lumbar spine.  Patient moderate guarding palpation of L4-S1.  Negative straight leg test. Neurologic:  Normal speech and language. No gross focal neurologic deficits are appreciated. No gait instability. Skin:  Skin is warm, dry and intact. No rash noted. Psychiatric: Mood and affect are normal. Speech and behavior are normal.  ____________________________________________   LABS (all labs ordered are listed, but only abnormal results are displayed)  Labs Reviewed - No data to display ____________________________________________  EKG   ____________________________________________  RADIOLOGY  ED MD interpretation:    Official radiology report(s): Dg Lumbar Spine Complete  Result Date: 06/03/2018 CLINICAL DATA:  Low back pain for 2 days EXAM: LUMBAR SPINE - COMPLETE 4+ VIEW COMPARISON:  None. FINDINGS: Five lumbar type vertebral bodies are well visualized. Vertebral body height is well maintained. No pars defects are seen. No anterolisthesis is noted. No disc space abnormality is noted. IMPRESSION: No acute abnormality seen. Electronically Signed   By: Alcide CleverMark  Lukens M.D.   On: 06/03/2018 13:27    ____________________________________________   PROCEDURES  Procedure(s) performed: None  Procedures  Critical Care performed: No  ____________________________________________   INITIAL IMPRESSION / ASSESSMENT AND PLAN / ED COURSE  As part of my medical decision making, I reviewed the following data within the electronic MEDICAL RECORD NUMBER    Back  pain secondary to acute lumbar  Strain.  Discussed x-ray findings with patient.  Patient given discharge care instruction work note.  Patient advised take medication as directed.  Patient [facility].      ____________________________________________   FINAL CLINICAL IMPRESSION(S) / ED DIAGNOSES  Final diagnoses:  Strain of lumbar region, initial encounter     ED Discharge Orders        Ordered    orphenadrine (NORFLEX) 100 MG tablet  2 times daily     06/03/18 1343    oxyCODONE-acetaminophen (PERCOCET) 7.5-325 MG tablet  Every 6 hours PRN     06/03/18 1343    naproxen (NAPROSYN) 500 MG tablet  2 times daily with meals     06/03/18 1343       Note:  This document was prepared using Dragon voice recognition software and may include unintentional dictation errors.    Joni ReiningSmith, Ronald K, PA-C 06/03/18 1350    Sharyn CreamerQuale, Mark, MD 06/03/18 1515

## 2018-06-03 NOTE — ED Notes (Signed)
Pt c/o low back pain since yesterday. Patient has a history of back problems.Yesterday he reports he lifted 40 pound bag yesterday and has pain when stands and walks ,Denies any numbness or loss of bowel bladder function

## 2018-06-03 NOTE — ED Triage Notes (Signed)
Pt states he was lifting something yesterday and felt a pop and is having pain radiating into BL legs. Denies any bowel or bladder issues. States he has a hx of back pain.

## 2018-11-16 IMAGING — CR DG LUMBAR SPINE COMPLETE 4+V
1 series · 5 of 5 positions shown · non-contrast
Comparison: None.

CLINICAL DATA: Low back pain for 2 days

EXAM:
LUMBAR SPINE - COMPLETE 4+ VIEW

[Series 1: dg lumbar spine complete 4 +v · 0.14mm/px · 5 of 5 slices shown]
[im 1/5]
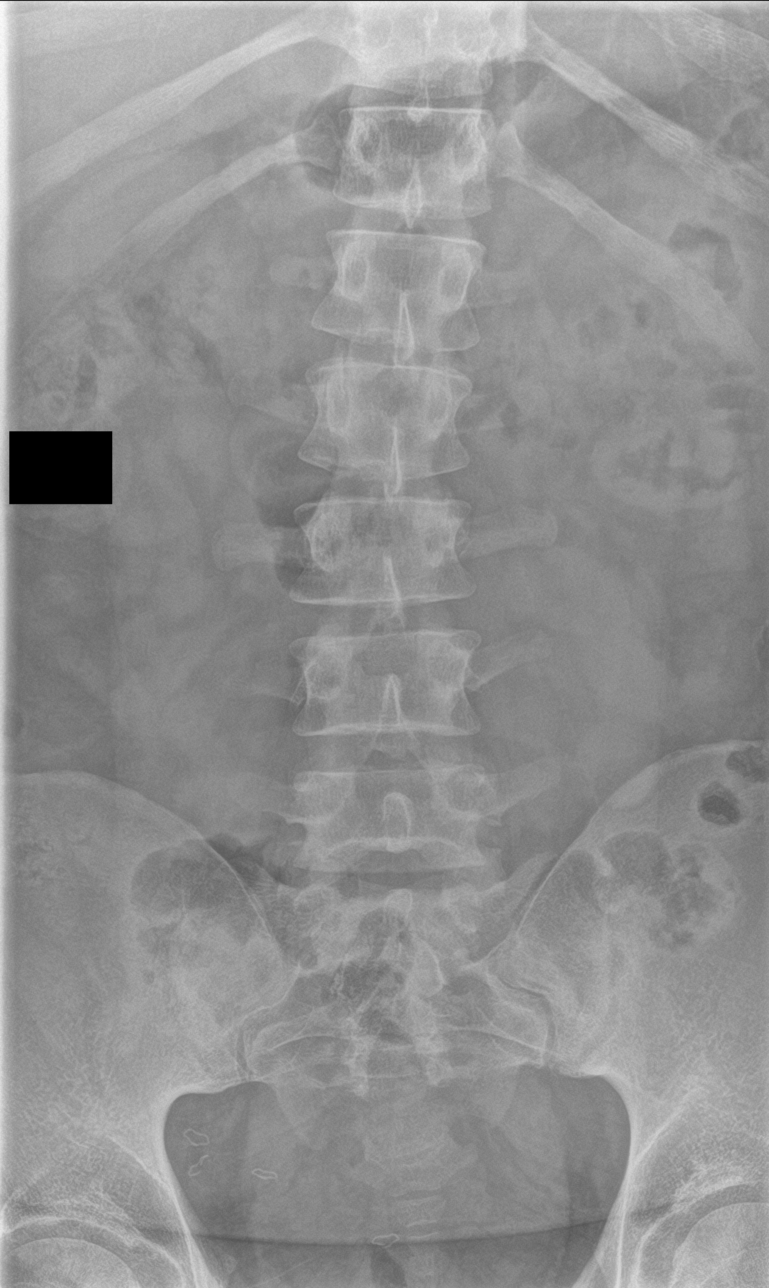
[im 2/5]
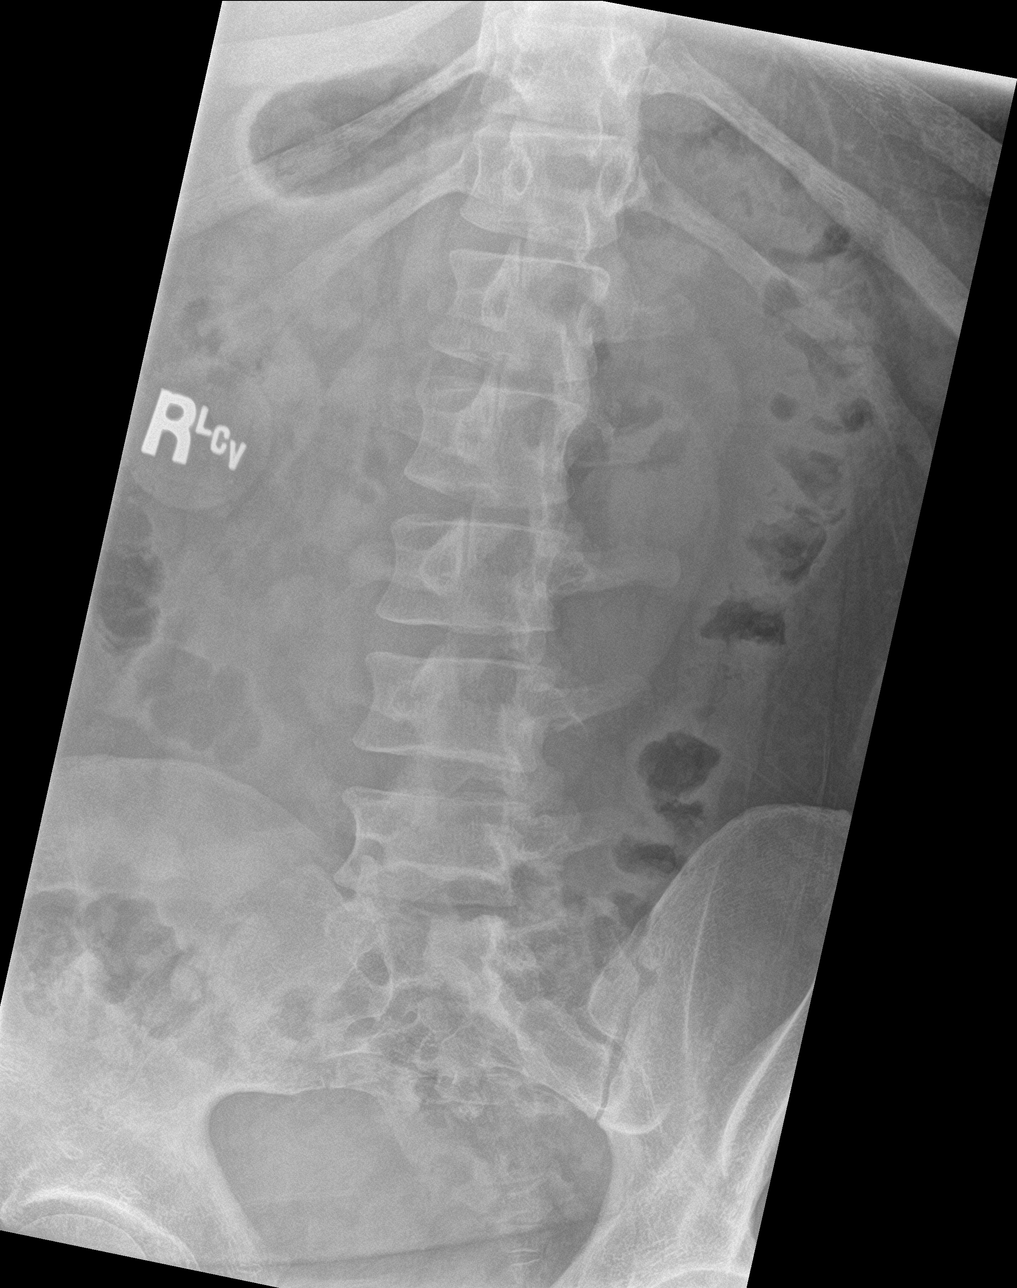
[im 3/5]
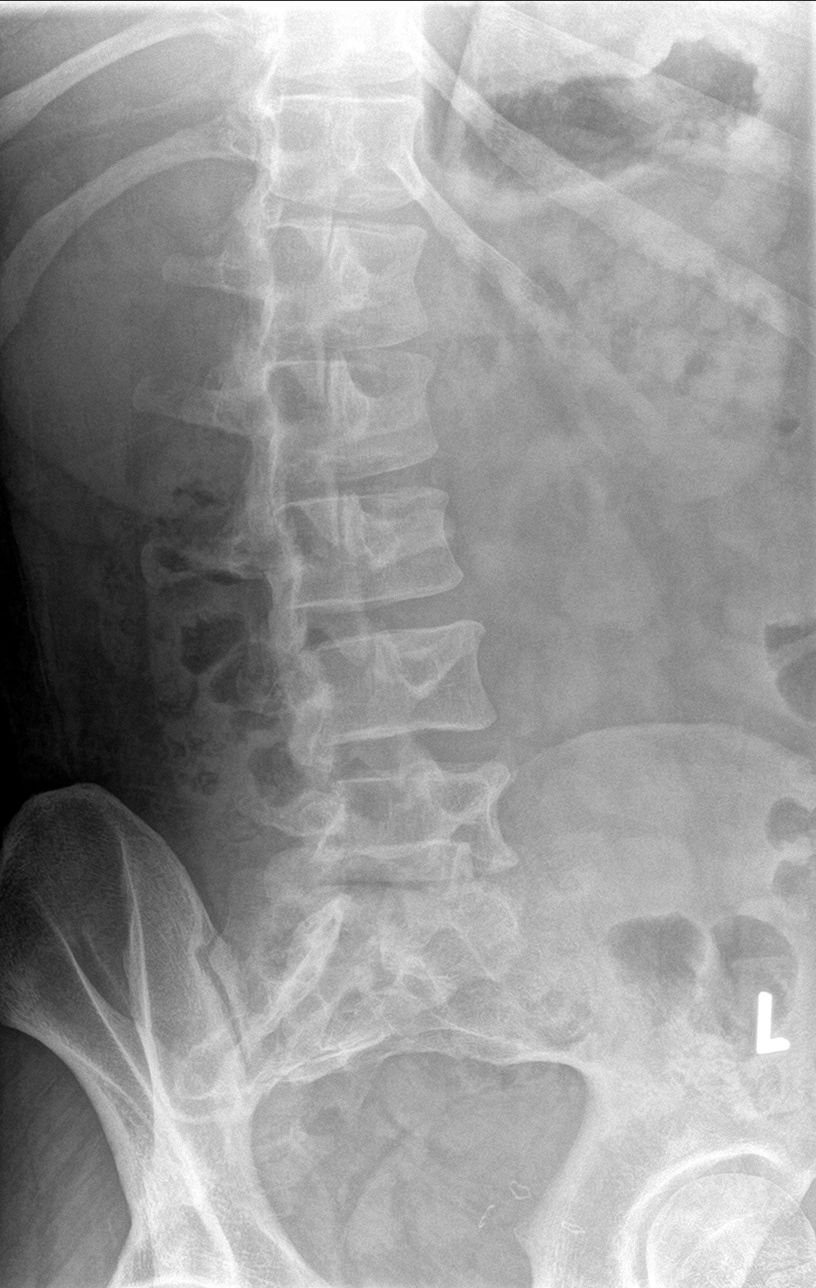
[im 4/5]
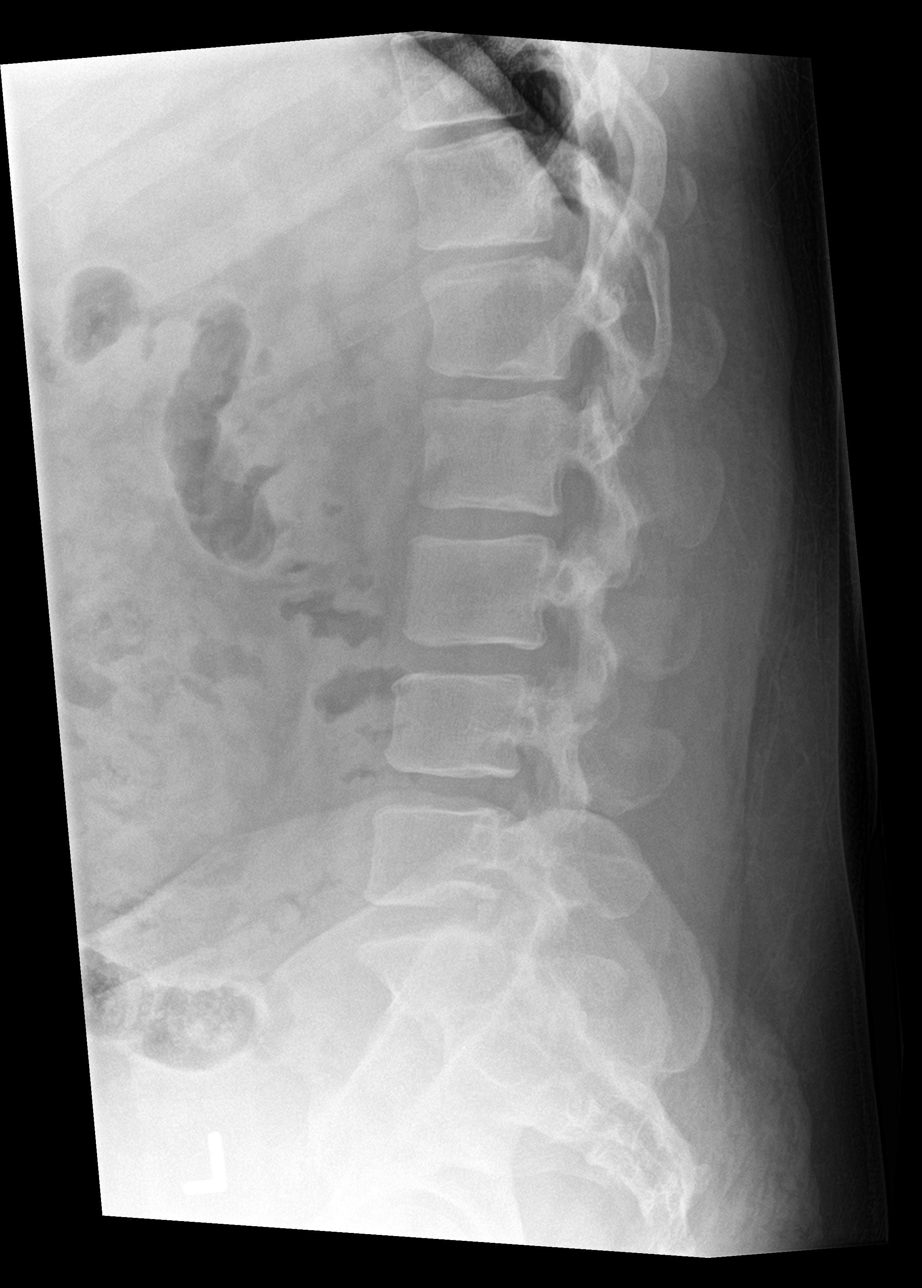
[im 5/5]
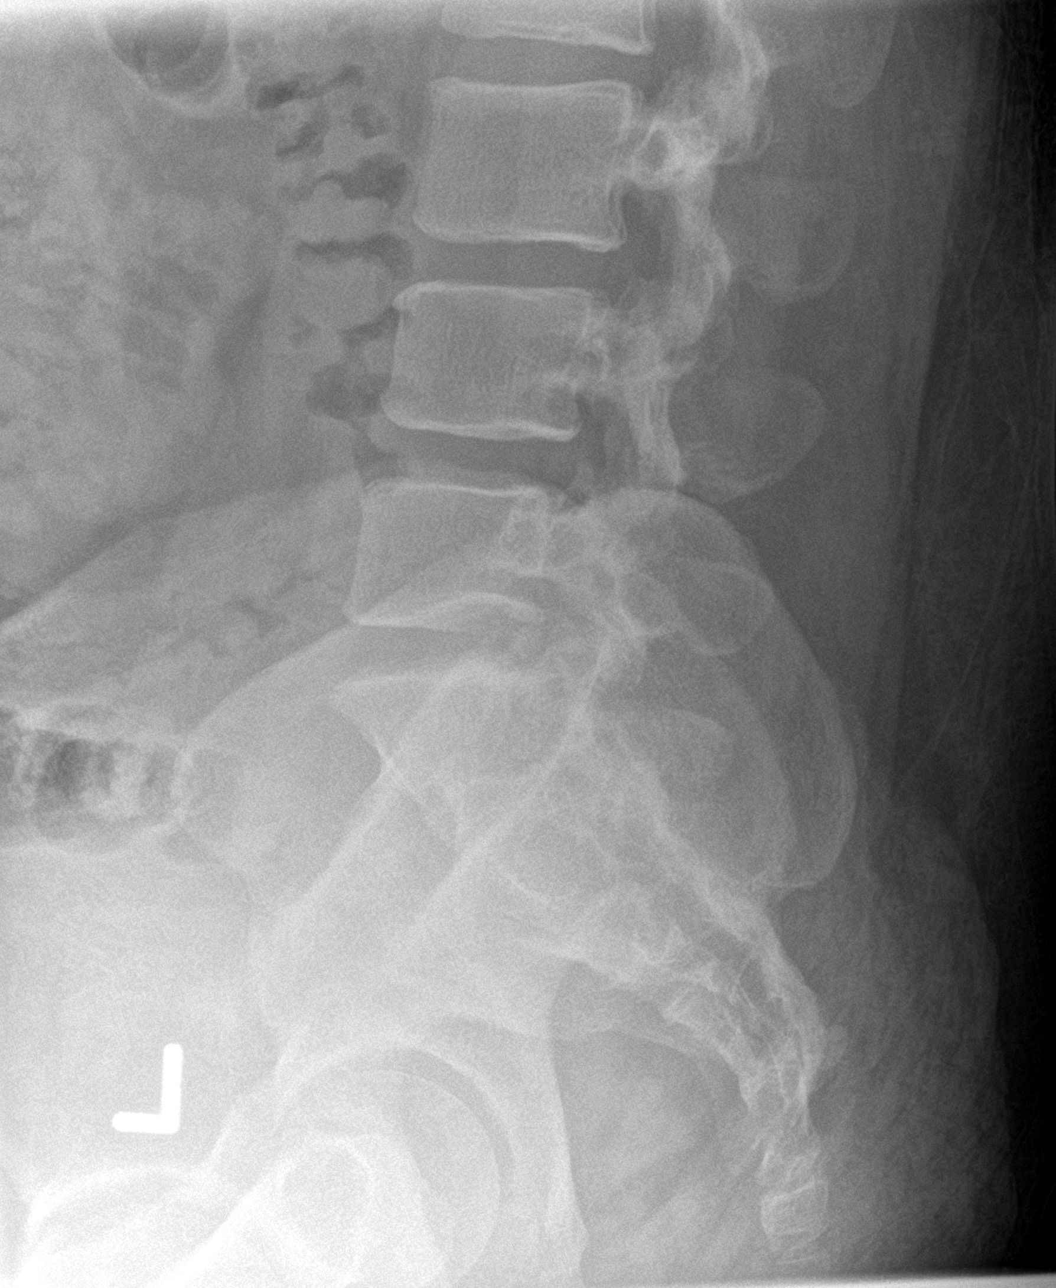

[5 of 5 positions shown; findings below may reference images not displayed]

FINDINGS: Five lumbar type vertebral bodies are well visualized. Vertebral
body height is well maintained. No pars defects are seen. No
anterolisthesis is noted. No disc space abnormality is noted.
IMPRESSION: No acute abnormality seen.
